# Patient Record
Sex: Male | Born: 1935
Health system: Southern US, Community
[De-identification: ages and names within clinical notes are randomized; demographics above are authoritative.]

## PROBLEM LIST (undated history)

## (undated) DIAGNOSIS — E119 Type 2 diabetes mellitus without complications: Secondary | ICD-10-CM

## (undated) DIAGNOSIS — I10 Essential (primary) hypertension: Secondary | ICD-10-CM

## (undated) HISTORY — PX: OTHER SURGICAL HISTORY: SHX169

## (undated) HISTORY — DX: Type 2 diabetes mellitus without complications: E11.9

## (undated) HISTORY — PX: CHOLECYSTECTOMY: SHX55

## (undated) HISTORY — DX: Essential (primary) hypertension: I10

---

## 2009-07-07 ENCOUNTER — Ambulatory Visit: Payer: Self-pay | Admitting: General Practice

## 2009-07-13 ENCOUNTER — Ambulatory Visit: Payer: Self-pay | Admitting: Surgery

## 2009-07-13 ENCOUNTER — Ambulatory Visit: Payer: Self-pay | Admitting: Cardiovascular Disease

## 2009-07-14 ENCOUNTER — Inpatient Hospital Stay: Payer: Self-pay | Admitting: Surgery

## 2012-11-04 ENCOUNTER — Ambulatory Visit: Payer: Self-pay | Admitting: Ophthalmology

## 2012-12-02 ENCOUNTER — Ambulatory Visit: Payer: Self-pay | Admitting: Ophthalmology

## 2013-04-25 ENCOUNTER — Emergency Department: Payer: Self-pay | Admitting: Emergency Medicine

## 2013-04-25 LAB — BASIC METABOLIC PANEL
Anion Gap: 10 (ref 7–16)
BUN: 22 mg/dL — ABNORMAL HIGH (ref 7–18)
Calcium, Total: 9.5 mg/dL (ref 8.5–10.1)
Chloride: 105 mmol/L (ref 98–107)
Creatinine: 0.77 mg/dL (ref 0.60–1.30)
EGFR (Non-African Amer.): >60
Sodium: 137 mmol/L (ref 136–145)

## 2013-04-25 LAB — CBC
HCT: 44 % (ref 40.0–52.0)
HGB: 14.9 g/dL (ref 13.0–18.0)
MCH: 33.8 pg (ref 26.0–34.0)
MCHC: 33.9 g/dL (ref 32.0–36.0)
Platelet: 187 10*3/uL (ref 150–440)

## 2013-04-27 ENCOUNTER — Encounter: Payer: Self-pay | Admitting: *Deleted

## 2013-04-28 ENCOUNTER — Encounter: Payer: Self-pay | Admitting: Cardiovascular Disease

## 2013-04-28 ENCOUNTER — Ambulatory Visit (INDEPENDENT_AMBULATORY_CARE_PROVIDER_SITE_OTHER): Payer: Medicare Other | Admitting: Cardiovascular Disease

## 2013-04-28 ENCOUNTER — Encounter (INDEPENDENT_AMBULATORY_CARE_PROVIDER_SITE_OTHER): Payer: Self-pay

## 2013-04-28 VITALS — BP 150/87 | HR 69 | Ht 67.0 in | Wt 198.2 lb

## 2013-04-28 DIAGNOSIS — R079 Chest pain, unspecified: Secondary | ICD-10-CM | POA: Insufficient documentation

## 2013-04-28 DIAGNOSIS — I10 Essential (primary) hypertension: Secondary | ICD-10-CM | POA: Insufficient documentation

## 2013-04-28 NOTE — Patient Instructions (Addendum)
[facility] MYOVIEW  Your caregiver has ordered a Stress Test with nuclear imaging. The purpose of this test is to evaluate the blood supply to your heart muscle. This procedure is referred to as a "Non-Invasive Stress Test." This is because other than having an IV started in your vein, nothing is inserted or "invades" your body. Cardiac stress tests are done to find areas of poor blood flow to the heart by determining the extent of coronary artery disease (CAD). Some patients exercise on a treadmill, which naturally increases the blood flow to your heart, while others who are  unable to walk on a treadmill due to physical limitations have a pharmacologic/chemical stress agent called Lexiscan . This medicine will mimic walking on a treadmill by temporarily increasing your coronary blood flow.   Please note: these test may take anywhere between 2-4 hours to complete  PLEASE REPORT TO [facility] MEDICAL MALL ENTRANCE  THE VOLUNTEERS AT THE FIRST DESK WILL DIRECT YOU WHERE TO GO  Date of Procedure:_____________12/26/14________________  Arrival Time for Procedure:________7:45 AM ______________________  Instructions regarding medication:   _x___ : Hold diabetes medication morning of procedure    PLEASE NOTIFY THE OFFICE AT LEAST 24 HOURS IN ADVANCE IF YOU ARE UNABLE TO KEEP YOUR APPOINTMENT.  [phone] AND  PLEASE NOTIFY NUCLEAR MEDICINE AT [facility] AT LEAST 24 HOURS IN ADVANCE IF YOU ARE UNABLE TO KEEP YOUR APPOINTMENT. [phone]  How to prepare for your Myoview test:  1. Do not eat or drink after midnight 2. No caffeine for 24 hours prior to test 3. No smoking 24 hours prior to test. 4. Your medication may be taken with water.  If your doctor stopped a medication because of this test, do not take that medication. 5. Ladies, please do not wear dresses.  Skirts or pants are appropriate. Please wear a short sleeve shirt. 6. No perfume, cologne or lotion. 7. Wear comfortable walking shoes. No  heels!

## 2013-04-28 NOTE — Assessment & Plan Note (Signed)
Blood pressure is mildly elevated but he appears to be anxious today. Continue to monitor.

## 2013-04-28 NOTE — Progress Notes (Signed)
Primary care physician: Dr. Bethann Punches  HPI  This is a pleasant 77 year old male who was referred from the emergency room at [facility] for evaluation of chest pain and abnormal EKG. He has no previous cardiac history. He has multiple chronic medical conditions that include prolonged history of type 2 diabetes, hypertension and possible gastroesophageal reflux disease. He had multiple evaluations in 2011 for abdominal and chest pain and ultimately he was diagnosed with cholecystitis. He underwent cholecystectomy with resolution of symptoms. He went to the emergency room at [facility] this past weekend due to substernal burning sensation which lasted all day. The pain did not radiate. There was no other associated symptoms. It improved with antacids. He was noted to have left bundle branch block on his EKG. He is not aware of this in the past. Chest x-ray showed no acute abnormalities. Cardiac enzymes were negative. His labs were overall unremarkable. He was discharged home on pantoprazole with improvement in symptoms. The patient is married and has 2 periods. He is retired from [employer]. He enjoys hunting and fishing. He is actually going on a hunting trip to [place] on Saturday.  Allergies  Allergen Reactions  . Sulfa Antibiotics      Current Outpatient Prescriptions on File Prior to Visit  Medication Sig Dispense Refill  . amoxicillin-clavulanate (AUGMENTIN) 500-125 MG per tablet Take 1 tablet by mouth as needed.       Marland Kitchen aspirin 81 MG tablet Take 81 mg by mouth daily.      Marland Kitchen glipiZIDE (GLUCOTROL) 5 MG tablet Take by mouth as needed.       Marland Kitchen lisinopril-hydrochlorothiazide (PRINZIDE,ZESTORETIC) 20-25 MG per tablet Take 1 tablet by mouth daily.      . metFORMIN (GLUCOPHAGE) 500 MG tablet Take by mouth 2 (two) times daily with a meal.      . traMADol (ULTRAM) 50 MG tablet Take by mouth 3 (three) times daily as needed.      . verapamil (COVERA HS) 180 MG (CO) 24 hr tablet Take 180 mg by mouth at bedtime.        No current facility-administered medications on file prior to visit.     Past Medical History  Diagnosis Date  . Diabetes mellitus without complication   . Hypertension      Past Surgical History  Procedure Laterality Date  . Cholecystectomy    . Cataract surgery       Family History  Problem Relation Age of Onset  . Stroke Mother      History   Social History  . Marital Status: Married    Spouse Name: N/A    Number of Children: N/A  . Years of Education: N/A   Occupational History  . Not on file.   Social History Main Topics  . Smoking status: Never Smoker   . Smokeless tobacco: Not on file  . Alcohol Use: No  . Drug Use: No  . Sexual Activity: Not on file   Other Topics Concern  . Not on file   Social History Narrative  . No narrative on file     ROS A 10 point review of system was performed. It is negative other than that mentioned in the history of present illness.   PHYSICAL EXAM   BP 150/87  Pulse 69  Ht 5\' 7"  (1.702 m)  Wt 198 lb 4 oz (89.926 kg)  BMI 31.04 kg/m2 Constitutional: He is oriented to person, place, and time. He appears well-developed and well-nourished. No distress.  HENT: No nasal discharge.  Head: Normocephalic and atraumatic.  Eyes: Pupils are equal and round.  No discharge. Neck: Normal range of motion. Neck supple. No JVD present. No thyromegaly present.  Cardiovascular: Normal rate, regular rhythm, normal heart sounds. Exam reveals no gallop and no friction rub. No murmur heard.  Pulmonary/Chest: Effort normal and breath sounds normal. No stridor. No respiratory distress. He has no wheezes. He has no rales. He exhibits no tenderness.  Abdominal: Soft. Bowel sounds are normal. He exhibits no distension. There is no tenderness. There is no rebound and no guarding.  Musculoskeletal: Normal range of motion. He exhibits no edema and no tenderness.  Neurological: He is alert and oriented to person, place, and time.  Coordination normal.  Skin: Skin is warm and dry. No rash noted. He is not diaphoretic. No erythema. No pallor.  Psychiatric: He has a normal mood and affect. His behavior is normal. Judgment and thought content normal.       EKG: Normal sinus rhythm with left bundle branch block.   ASSESSMENT AND PLAN

## 2013-04-28 NOTE — Assessment & Plan Note (Signed)
I suspect that his current chest pain is likely due to GERD. However, he has a left bundle branch block on his EKG and has multiple risk factors for coronary artery disease. Due to that, I recommend further evaluation with a pharmacologic nuclear stress test. If cardiac workup is negative and the patient continues to have recurrent symptoms, further GI evaluation might be needed.

## 2013-04-30 ENCOUNTER — Other Ambulatory Visit: Payer: Self-pay | Admitting: *Deleted

## 2013-04-30 ENCOUNTER — Ambulatory Visit: Payer: Self-pay | Admitting: Cardiovascular Disease

## 2013-04-30 DIAGNOSIS — R079 Chest pain, unspecified: Secondary | ICD-10-CM

## 2013-05-20 ENCOUNTER — Ambulatory Visit: Payer: Self-pay | Admitting: Cardiovascular Disease

## 2015-07-27 DIAGNOSIS — J40 Bronchitis, not specified as acute or chronic: Secondary | ICD-10-CM | POA: Diagnosis not present

## 2015-10-17 DIAGNOSIS — Z0000 Encounter for general adult medical examination without abnormal findings: Secondary | ICD-10-CM | POA: Diagnosis not present

## 2015-10-17 DIAGNOSIS — E119 Type 2 diabetes mellitus without complications: Secondary | ICD-10-CM | POA: Diagnosis not present

## 2015-10-24 DIAGNOSIS — E119 Type 2 diabetes mellitus without complications: Secondary | ICD-10-CM | POA: Diagnosis not present

## 2015-10-24 DIAGNOSIS — I10 Essential (primary) hypertension: Secondary | ICD-10-CM | POA: Diagnosis not present

## 2015-10-31 DIAGNOSIS — H0011 Chalazion right upper eyelid: Secondary | ICD-10-CM | POA: Diagnosis not present

## 2015-11-29 DIAGNOSIS — L03031 Cellulitis of right toe: Secondary | ICD-10-CM | POA: Diagnosis not present

## 2015-11-29 DIAGNOSIS — L02611 Cutaneous abscess of right foot: Secondary | ICD-10-CM | POA: Diagnosis not present

## 2016-04-05 DIAGNOSIS — L089 Local infection of the skin and subcutaneous tissue, unspecified: Secondary | ICD-10-CM | POA: Diagnosis not present

## 2016-04-05 DIAGNOSIS — Z23 Encounter for immunization: Secondary | ICD-10-CM | POA: Diagnosis not present

## 2016-04-05 DIAGNOSIS — S60940A Unspecified superficial injury of right index finger, initial encounter: Secondary | ICD-10-CM | POA: Diagnosis not present

## 2016-04-05 DIAGNOSIS — K2900 Acute gastritis without bleeding: Secondary | ICD-10-CM | POA: Diagnosis not present

## 2016-04-16 DIAGNOSIS — M216X1 Other acquired deformities of right foot: Secondary | ICD-10-CM | POA: Diagnosis not present

## 2016-04-16 DIAGNOSIS — D2371 Other benign neoplasm of skin of right lower limb, including hip: Secondary | ICD-10-CM | POA: Diagnosis not present

## 2016-04-16 DIAGNOSIS — M79671 Pain in right foot: Secondary | ICD-10-CM | POA: Diagnosis not present

## 2016-04-17 DIAGNOSIS — E119 Type 2 diabetes mellitus without complications: Secondary | ICD-10-CM | POA: Diagnosis not present

## 2016-04-17 DIAGNOSIS — Z125 Encounter for screening for malignant neoplasm of prostate: Secondary | ICD-10-CM | POA: Diagnosis not present

## 2016-04-22 DIAGNOSIS — E119 Type 2 diabetes mellitus without complications: Secondary | ICD-10-CM | POA: Diagnosis not present

## 2016-04-24 DIAGNOSIS — E119 Type 2 diabetes mellitus without complications: Secondary | ICD-10-CM | POA: Diagnosis not present

## 2016-04-24 DIAGNOSIS — Z0000 Encounter for general adult medical examination without abnormal findings: Secondary | ICD-10-CM | POA: Diagnosis not present

## 2016-04-24 DIAGNOSIS — M216X1 Other acquired deformities of right foot: Secondary | ICD-10-CM | POA: Diagnosis not present

## 2016-07-25 DIAGNOSIS — J042 Acute laryngotracheitis: Secondary | ICD-10-CM | POA: Diagnosis not present

## 2016-09-26 DIAGNOSIS — M109 Gout, unspecified: Secondary | ICD-10-CM | POA: Diagnosis not present

## 2016-10-16 DIAGNOSIS — E119 Type 2 diabetes mellitus without complications: Secondary | ICD-10-CM | POA: Diagnosis not present

## 2016-10-23 DIAGNOSIS — Z0000 Encounter for general adult medical examination without abnormal findings: Secondary | ICD-10-CM | POA: Diagnosis not present

## 2016-10-23 DIAGNOSIS — E119 Type 2 diabetes mellitus without complications: Secondary | ICD-10-CM | POA: Diagnosis not present

## 2016-10-23 DIAGNOSIS — Z125 Encounter for screening for malignant neoplasm of prostate: Secondary | ICD-10-CM | POA: Diagnosis not present

## 2017-01-27 ENCOUNTER — Emergency Department
Admission: EM | Admit: 2017-01-27 | Discharge: 2017-01-27 | Disposition: A | Discharge status: Home / Self Care | Payer: PPO | Attending: Emergency Medicine | Admitting: Emergency Medicine

## 2017-01-27 ENCOUNTER — Emergency Department: Payer: PPO

## 2017-01-27 ENCOUNTER — Encounter: Payer: Self-pay | Admitting: Emergency Medicine

## 2017-01-27 DIAGNOSIS — S0990XA Unspecified injury of head, initial encounter: Secondary | ICD-10-CM | POA: Diagnosis not present

## 2017-01-27 DIAGNOSIS — Z7984 Long term (current) use of oral hypoglycemic drugs: Secondary | ICD-10-CM | POA: Insufficient documentation

## 2017-01-27 DIAGNOSIS — Z79899 Other long term (current) drug therapy: Secondary | ICD-10-CM | POA: Diagnosis not present

## 2017-01-27 DIAGNOSIS — M546 Pain in thoracic spine: Secondary | ICD-10-CM

## 2017-01-27 DIAGNOSIS — W228XXA Striking against or struck by other objects, initial encounter: Secondary | ICD-10-CM | POA: Insufficient documentation

## 2017-01-27 DIAGNOSIS — S0003XA Contusion of scalp, initial encounter: Secondary | ICD-10-CM | POA: Diagnosis not present

## 2017-01-27 DIAGNOSIS — Y9301 Activity, walking, marching and hiking: Secondary | ICD-10-CM | POA: Diagnosis not present

## 2017-01-27 DIAGNOSIS — Z7982 Long term (current) use of aspirin: Secondary | ICD-10-CM | POA: Insufficient documentation

## 2017-01-27 DIAGNOSIS — W19XXXA Unspecified fall, initial encounter: Secondary | ICD-10-CM

## 2017-01-27 DIAGNOSIS — I10 Essential (primary) hypertension: Secondary | ICD-10-CM | POA: Diagnosis not present

## 2017-01-27 DIAGNOSIS — Y999 Unspecified external cause status: Secondary | ICD-10-CM | POA: Insufficient documentation

## 2017-01-27 DIAGNOSIS — Y929 Unspecified place or not applicable: Secondary | ICD-10-CM | POA: Insufficient documentation

## 2017-01-27 DIAGNOSIS — M545 Low back pain, unspecified: Secondary | ICD-10-CM

## 2017-01-27 DIAGNOSIS — E119 Type 2 diabetes mellitus without complications: Secondary | ICD-10-CM | POA: Diagnosis not present

## 2017-01-27 DIAGNOSIS — M62830 Muscle spasm of back: Secondary | ICD-10-CM | POA: Insufficient documentation

## 2017-01-27 MED ORDER — KETOROLAC TROMETHAMINE 60 MG/2ML IM SOLN
30.0000 mg | Freq: Once | INTRAMUSCULAR | Status: AC
  Administered 2017-01-27: 30 mg via INTRAMUSCULAR
  Filled 2017-01-27: qty 2

## 2017-01-27 MED ORDER — CYCLOBENZAPRINE HCL 5 MG PO TABS: 5.0000 mg | ORAL_TABLET | Freq: Three times a day (TID) | ORAL | 0 refills | Status: AC | PRN

## 2017-01-27 MED ORDER — KETOROLAC TROMETHAMINE 10 MG PO TABS: 10.0000 mg | ORAL_TABLET | Freq: Four times a day (QID) | ORAL | 0 refills | Status: AC | PRN

## 2017-01-27 NOTE — ED Notes (Signed)
See triage note  States he was trying to stop a golf cart  And fell from cart  Hit head no cement  Large hematoma noted  But also having pain to mid to lower back  Increased pain with walking and breathing

## 2017-01-27 NOTE — ED Triage Notes (Signed)
Patient presents to ED via POV from home post fall. Patient was attempting to stop his golf cart from rolling when he fell and hit the back of his head on the cement. No bleeding noted. Patient denies taking any blood thinners. Denies LOC.

## 2017-01-27 NOTE — ED Provider Notes (Signed)
[facility] Emergency Department Provider Note   ____________________________________________   I have reviewed the triage vital signs and the nursing notes.   HISTORY  Chief Complaint Head Injury    HPI Jonathan Hayden. is a 81 y.o. male presents to the department with hematoma along the posterior scalp he sustained when a ladder sitting in a golf cart hit the acceleration pedal pulling him along when he lost his balance and hit the back of his head and his back. Patient denies loss of consciousness and he recalls the accident.Patient reports difficulty getting to a standing position due to thoracic and lumbar back pain. He cried assistance of his wife once she arrived home. Patient denies vision changes, nausea, vomiting or changes in his neurological baseline. Patient's wife confirmed no change in neurological baseline. Patient reports long history of chronic back pain with recent exacerbation when he was climbing onto his tractor and driving it several hours earlier this summer. Patient denies fever, chills, headache, vision changes, chest pain, chest tightness, shortness of breath, abdominal pain, nausea and vomiting.  Past Medical History:  Diagnosis Date  . Diabetes mellitus without complication ([place])   . Hypertension     Patient Active Problem List   Diagnosis Date Noted  . Chest pain 04/28/2013  . Hypertension     Past Surgical History:  Procedure Laterality Date  . CATARACT SURGERY    . CHOLECYSTECTOMY      Prior to Admission medications   Medication Sig Start Date End Date Taking? Authorizing Provider  amoxicillin-clavulanate (AUGMENTIN) 500-125 MG per tablet Take 1 tablet by mouth as needed.     [provider]  aspirin 81 MG tablet Take 81 mg by mouth daily.    [provider]  Coenzyme Q10 (CO Q 10) 100 MG CAPS Take by mouth daily.    [provider]  Cyanocobalamin (VITAMIN B-12 SL) Place under the tongue  daily.    [provider]  cyclobenzaprine (FLEXERIL) 5 MG tablet Take 1 tablet (5 mg total) by mouth 3 (three) times daily as needed. 01/27/17   Little, Traci M, PA-C  glipiZIDE (GLUCOTROL) 5 MG tablet Take by mouth as needed.     [provider]  lisinopril-hydrochlorothiazide (PRINZIDE,ZESTORETIC) 20-25 MG per tablet Take 1 tablet by mouth daily.    [provider]  metFORMIN (GLUCOPHAGE) 500 MG tablet Take by mouth 2 (two) times daily with a meal.    [provider]  Omega-3 Fatty Acids (FISH OIL) 1200 MG CAPS Take by mouth 2 (two) times daily.    [provider]  omeprazole (PRILOSEC) 40 MG capsule Take 40 mg by mouth daily.    [provider]  pantoprazole (PROTONIX) 20 MG tablet Take 20 mg by mouth as needed.    [provider]  Resveratrol 250 MG CAPS Take by mouth daily.    [provider]  SIMETHICONE PO Take by mouth as needed.    [provider]  traMADol (ULTRAM) 50 MG tablet Take by mouth 3 (three) times daily as needed.    [provider]  verapamil (COVERA HS) 180 MG (CO) 24 hr tablet Take 180 mg by mouth at bedtime.    [provider]    Allergies Sulfa antibiotics  Family History  Problem Relation Age of Onset  . Stroke Mother     Social History Social History  Substance Use Topics  . Smoking status: Never Smoker  . Smokeless tobacco: Not  on file  . Alcohol use No    Review of Systems Constitutional: Negative for fever/chills Head: Hematoma along posterior scalp tenderness to palpation. Eyes: No visual changes. ENT:  Negative for sore throat and for difficulty swallowing Cardiovascular: Denies chest pain. Respiratory: Denies cough. Denies shortness of breath. Musculoskeletal: Positive for thoracic and lumbar back pain. Skin: Negative for rash. Neurological: Negative for headaches.  Negative focal weakness or numbness. Negative for loss of consciousness. Able to  ambulate. ____________________________________________   PHYSICAL EXAM:  VITAL SIGNS: ED Triage Vitals [01/27/17 1536]  Enc Vitals Group     BP 133/88     Pulse Rate 99     Resp 18     Temp 98 F (36.7 C)     Temp Source Oral     SpO2 97 %     Weight 196 lb (88.9 kg)     Height 5\' 6"  (1.676 m)     Head Circumference      Peak Flow      Pain Score 3     Pain Loc      Pain Edu?      Excl. in [place]?     Constitutional: Alert and oriented. Well appearing and in no acute distress.  Eyes: Conjunctivae are normal. PERRL. EOMI  Head: Normocephalic and atraumatic. Hematoma along the posterior scalp.  ENT:      Ears: Canals clear. TMs intact bilaterally.      Nose: No congestion/rhinnorhea.      Mouth/Throat: Mucous membranes are moist. Neck:Supple. No thyromegaly. No stridor.  Cardiovascular: Normal rate, regular rhythm. Normal S1 and S2.  Good peripheral circulation. Respiratory: Normal respiratory effort without tachypnea or retractions. Lungs CTAB. No wheezes/rales/rhonchi. Good air entry to the bases with no decreased or absent breath sounds. Cardiovascular: Normal rate, regular rhythm. Normal distal pulses. Gastrointestinal: Bowel sounds 4 quadrants. Soft and nontender to palpation. No guarding or rigidity. No nausea vomiting or diarrhea. Musculoskeletal: Nontender with normal range of motion in all extremities. Thoracic and lumbar back pain without tenderness along spinous processes or deformity. Palpable tenderness along the paraspinal musculature. Negative radiculopathy, bowel or bladder dsyfunction or saddle anesthesia.  Neurologic: Normal speech and language. No gross focal neurologic deficits are appreciated. No gait instability.  Skin:  Skin is warm, dry and intact. No rash noted. Psychiatric: Mood and affect are normal. Speech and behavior are normal. Patient exhibits appropriate insight and judgement.  ____________________________________________   LABS (all labs  ordered are listed, but only abnormal results are displayed)  Labs Reviewed - No data to display ____________________________________________  EKG none ____________________________________________  RADIOLOGY CT head w/o contrast IMPRESSION: No acute intracranial process.  Soft tissue swelling overlying the dorsal left calvarium.  ____________________________________________   PROCEDURES  Procedure(s) performed: no    Critical Care performed: no ____________________________________________   INITIAL IMPRESSION / ASSESSMENT AND PLAN / ED COURSE  Pertinent labs & imaging results that were available during my care of the patient were reviewed by me and considered in my medical decision making (see chart for details).   Patient presents to emergency department with posterior scalp hematoma, thoracic and lumbar back pain pain following a fall earlier today as described in the history above. History, physical exam findings and imaging are reassuring symptoms are consistent with thoracic and lumbar strain and symptoms related to scalp hematoma. Patient noted improvement of symptoms following Toradol given during the course of care in the emergency department. Patient will be prescribed flexeril  as needed for  muscle spasms. Patient advised to follow up with PCP as needed or return to the emergency department if symptoms return or worsen. Patient informed of clinical course, understand medical decision-making process, and agree with plan.   ____________________________________________   FINAL CLINICAL IMPRESSION(S) / ED DIAGNOSES  Final diagnoses:  Contusion of scalp, initial encounter  Minor head injury, initial encounter  Lumbar back pain  Acute bilateral thoracic back pain  Spasm of back muscles  Fall, initial encounter       NEW MEDICATIONS STARTED DURING THIS VISIT:  New Prescriptions   CYCLOBENZAPRINE (FLEXERIL) 5 MG TABLET    Take 1 tablet (5 mg total) by mouth  3 (three) times daily as needed.     Note:  This document was prepared using Dragon voice recognition software and may include unintentional dictation errors.    Alric Quan 01/27/17 1840    Harvest Dark, MD 01/28/17 2016

## 2017-01-27 NOTE — Discharge Instructions (Signed)
Continue to monitor symptoms for the next 24 hours if you notice changes in neurological baseline, vision changes, nausea or vomiting do not hesitate to return to the emergency department.  You may take over-the-counter Tylenol or ibuprofen for pain and inflammation. Cold or heat therapy to reduce back symptoms as well as modify activities until symptoms improve.

## 2017-04-04 DIAGNOSIS — H00012 Hordeolum externum right lower eyelid: Secondary | ICD-10-CM | POA: Diagnosis not present

## 2017-04-23 DIAGNOSIS — Z125 Encounter for screening for malignant neoplasm of prostate: Secondary | ICD-10-CM | POA: Diagnosis not present

## 2017-04-23 DIAGNOSIS — E119 Type 2 diabetes mellitus without complications: Secondary | ICD-10-CM | POA: Diagnosis not present

## 2017-04-25 DIAGNOSIS — E119 Type 2 diabetes mellitus without complications: Secondary | ICD-10-CM | POA: Diagnosis not present

## 2017-04-25 DIAGNOSIS — M659 Synovitis and tenosynovitis, unspecified: Secondary | ICD-10-CM | POA: Diagnosis not present

## 2017-04-25 DIAGNOSIS — Z0000 Encounter for general adult medical examination without abnormal findings: Secondary | ICD-10-CM | POA: Diagnosis not present

## 2018-10-09 DIAGNOSIS — E119 Type 2 diabetes mellitus without complications: Secondary | ICD-10-CM | POA: Diagnosis not present

## 2018-10-16 DIAGNOSIS — Z0000 Encounter for general adult medical examination without abnormal findings: Secondary | ICD-10-CM | POA: Diagnosis not present

## 2018-10-16 DIAGNOSIS — E119 Type 2 diabetes mellitus without complications: Secondary | ICD-10-CM | POA: Diagnosis not present

## 2018-10-16 DIAGNOSIS — E538 Deficiency of other specified B group vitamins: Secondary | ICD-10-CM | POA: Diagnosis not present

## 2018-10-16 DIAGNOSIS — Z125 Encounter for screening for malignant neoplasm of prostate: Secondary | ICD-10-CM | POA: Diagnosis not present

## 2018-10-16 DIAGNOSIS — M791 Myalgia, unspecified site: Secondary | ICD-10-CM | POA: Diagnosis not present

## 2018-12-18 DIAGNOSIS — M19012 Primary osteoarthritis, left shoulder: Secondary | ICD-10-CM | POA: Diagnosis not present

## 2018-12-18 DIAGNOSIS — M19011 Primary osteoarthritis, right shoulder: Secondary | ICD-10-CM | POA: Diagnosis not present

## 2019-01-25 DIAGNOSIS — M5116 Intervertebral disc disorders with radiculopathy, lumbar region: Secondary | ICD-10-CM | POA: Diagnosis not present

## 2019-01-25 DIAGNOSIS — M25521 Pain in right elbow: Secondary | ICD-10-CM | POA: Diagnosis not present

## 2019-02-17 ENCOUNTER — Ambulatory Visit
Admission: EM | Admit: 2019-02-17 | Discharge: 2019-02-17 | Disposition: A | Discharge status: Home / Self Care | Payer: PPO | Attending: Family Medicine | Admitting: Family Medicine

## 2019-02-17 ENCOUNTER — Other Ambulatory Visit: Payer: Self-pay

## 2019-02-17 DIAGNOSIS — H6123 Impacted cerumen, bilateral: Secondary | ICD-10-CM

## 2019-02-17 NOTE — ED Triage Notes (Signed)
Patient states that he went to be fitted for hearing aids this morning but was told he had bilateral wax buildup and they couldn't do it.

## 2019-02-17 NOTE — ED Provider Notes (Signed)
[facility] ____________________________________________  Time seen: Approximately 2:52 PM  I have reviewed the triage vital signs and the nursing notes.   HISTORY  Chief Complaint Cerumen Impaction  HPI Jonathan Hayden. is a 83 y.o. male presenting for evaluation of bilateral ear wax impaction.  Patient states that he went to get fitted for hearing aids but they were unable to do so due to the wax being present. Denies any pain. Tried to remove wax himself at home unsuccessfully.  Denies any acute hearing changes.  No recent cough or sickness.  Rusty Aus, MD: PCP   Past Medical History:  Diagnosis Date  . Diabetes mellitus without complication ([place])   . Hypertension     Patient Active Problem List   Diagnosis Date Noted  . Chest pain 04/28/2013  . Hypertension     Past Surgical History:  Procedure Laterality Date  . CATARACT SURGERY    . CHOLECYSTECTOMY       No current facility-administered medications for this encounter.   Current Outpatient Medications:  .  Cyanocobalamin (VITAMIN B-12 SL), Place under the tongue daily., Disp: , Rfl:  .  glipiZIDE (GLUCOTROL) 5 MG tablet, Take by mouth as needed. , Disp: , Rfl:  .  lisinopril-hydrochlorothiazide (PRINZIDE,ZESTORETIC) 20-25 MG per tablet, Take 1 tablet by mouth daily., Disp: , Rfl:  .  metFORMIN (GLUCOPHAGE) 500 MG tablet, Take by mouth 2 (two) times daily with a meal., Disp: , Rfl:  .  omeprazole (PRILOSEC) 40 MG capsule, Take 40 mg by mouth daily., Disp: , Rfl:  .  Resveratrol 250 MG CAPS, Take by mouth daily., Disp: , Rfl:  .  SIMETHICONE PO, Take by mouth as needed., Disp: , Rfl:  .  verapamil (COVERA HS) 180 MG (CO) 24 hr tablet, Take 180 mg by mouth at bedtime., Disp: , Rfl:  .  amoxicillin-clavulanate (AUGMENTIN) 500-125 MG per tablet, Take 1 tablet by mouth as needed. , Disp: , Rfl:  .  aspirin 81 MG tablet, Take 81 mg by mouth daily., Disp: , Rfl:  .  Coenzyme Q10 (CO Q 10) 100 MG CAPS,  Take by mouth daily., Disp: , Rfl:  .  cyclobenzaprine (FLEXERIL) 5 MG tablet, Take 1 tablet (5 mg total) by mouth 3 (three) times daily as needed., Disp: 20 tablet, Rfl: 0 .  Omega-3 Fatty Acids (FISH OIL) 1200 MG CAPS, Take by mouth 2 (two) times daily., Disp: , Rfl:  .  pantoprazole (PROTONIX) 20 MG tablet, Take 20 mg by mouth as needed., Disp: , Rfl:  .  traMADol (ULTRAM) 50 MG tablet, Take by mouth 3 (three) times daily as needed., Disp: , Rfl:   Allergies Sulfa antibiotics  Family History  Problem Relation Age of Onset  . Stroke Mother     Social History Social History   Tobacco Use  . Smoking status: Never Smoker  . Smokeless tobacco: Never Used  Substance Use Topics  . Alcohol use: Yes    Comment: occasionally  . Drug use: No    Review of Systems Constitutional: No fever ENT: As above.  Cardiovascular: Denies chest pain. Respiratory: Denies shortness of breath. Musculoskeletal: Negative for back pain. Skin: Negative for rash.  ____________________________________________   PHYSICAL EXAM:  VITAL SIGNS: ED Triage Vitals  Enc Vitals Group     BP 02/17/19 1438 130/84     Pulse Rate 02/17/19 1438 87     Resp 02/17/19 1438 18     Temp 02/17/19 1438 98.6 F (  37 C)     Temp Source 02/17/19 1438 Oral     SpO2 02/17/19 1438 99 %     Weight 02/17/19 1433 184 lb (83.5 kg)     Height 02/17/19 1433 5\' 6"  (1.676 m)     Head Circumference --      Peak Flow --      Pain Score 02/17/19 1433 0     Pain Loc --      Pain Edu? --      Excl. in [place]? --     Constitutional: Alert and oriented. Well appearing and in no acute distress. Eyes: Conjunctivae are normal.  ENT      Head: Normocephalic.      Ears: Right: Nontender, total cerumen impaction, post cerumen removal, canal clear, no erythema, normal TM.  Left: Nontender, total cerumen impaction, post removal, canal clear, non-tender, normal TM. Musculoskeletal:  Steady gait.  Neurologic:  Normal speech and language.   Skin:  Skin is warm, dry  Psychiatric: Mood and affect are normal.    ___________________________________________   LABS (all labs ordered are listed, but only abnormal results are displayed)  Labs Reviewed - No data to display   PROCEDURES Procedures   Cerumen impaction.  Cerumen removed by nursing staff with irrigation.  Post cerumen removal, canal cleared.  Patient tolerated well.  INITIAL IMPRESSION / ASSESSMENT AND PLAN / ED COURSE  Pertinent labs & imaging results that were available during my care of the patient were reviewed by me and considered in my medical decision making (see chart for details).  Well-appearing patient.  No acute distress.  Total cerumen impaction bilaterally, removed with irrigation.  Patient tolerated well.  Clear post removal.  Discussed follow up and return parameters including no resolution or any worsening concerns. Patient verbalized understanding and agreed to plan.   ____________________________________________   FINAL CLINICAL IMPRESSION(S) / ED DIAGNOSES  Final diagnoses:  Bilateral impacted cerumen     ED Discharge Orders    None       Note: This dictation was prepared with Dragon dictation along with smaller phrase technology. Any transcriptional errors that result from this process are unintentional.         Marylene Land, NP 02/17/19 1532

## 2019-04-02 ENCOUNTER — Other Ambulatory Visit: Payer: Self-pay

## 2019-04-02 ENCOUNTER — Ambulatory Visit
Admission: EM | Admit: 2019-04-02 | Discharge: 2019-04-02 | Disposition: A | Discharge status: Home / Self Care | Payer: PPO | Attending: Family Medicine | Admitting: Family Medicine

## 2019-04-02 ENCOUNTER — Encounter: Payer: Self-pay | Admitting: Emergency Medicine

## 2019-04-02 DIAGNOSIS — Z20828 Contact with and (suspected) exposure to other viral communicable diseases: Secondary | ICD-10-CM

## 2019-04-02 DIAGNOSIS — R197 Diarrhea, unspecified: Secondary | ICD-10-CM

## 2019-04-02 DIAGNOSIS — Z20822 Contact with and (suspected) exposure to covid-19: Secondary | ICD-10-CM

## 2019-04-02 NOTE — ED Triage Notes (Signed)
Pt c/o diarrhea, fatigue and body. Started about a week ago.

## 2019-04-02 NOTE — ED Provider Notes (Signed)
[facility]    CSN: [id number] Arrival date & time: 04/02/19  1118      History   Chief Complaint Chief Complaint  Patient presents with   Diarrhea    HPI Jonathan Hayden. is a 83 y.o. male.   Subjective:   Jonathan Hayden. is a 83 y.o. male who presents for evaluation of influenza/COVID like symptoms. Symptoms include chills, dizziness, nausea and diarrhea. He denies any fevers, body aches, sore throat, cough, shortness of breath, vomiting, headache or change in taste/smell. Symptoms have been present for 5 days. He has tried to alleviate the symptoms with rest and OTC anti-diarrheal medications with minimal relief.  He denies any known exposure to COVID-19.  High risk factors for COVID complications include age greater than 68 years of age and co-morbid illness.  The following portions of the patient's history were reviewed and updated as appropriate: allergies, current medications, past family history, past medical history, past social history, past surgical history and problem list.        Past Medical History:  Diagnosis Date   Diabetes mellitus without complication ([place])    Hypertension     Patient Active Problem List   Diagnosis Date Noted   Chest pain 04/28/2013   Hypertension     Past Surgical History:  Procedure Laterality Date   CATARACT SURGERY     CHOLECYSTECTOMY         Home Medications    Prior to Admission medications   Medication Sig Start Date End Date Taking? Authorizing Provider  Cyanocobalamin (VITAMIN B-12 SL) Place under the tongue daily.   Yes [provider]  lisinopril-hydrochlorothiazide (PRINZIDE,ZESTORETIC) 20-25 MG per tablet Take 1 tablet by mouth daily.   Yes [provider]  metFORMIN (GLUCOPHAGE) 500 MG tablet Take by mouth 2 (two) times daily with a meal.   Yes [provider]  omeprazole (PRILOSEC) 40 MG capsule Take 40 mg by mouth daily.   Yes [provider]    SIMETHICONE PO Take by mouth as needed.   Yes [provider]  traMADol (ULTRAM) 50 MG tablet Take by mouth 3 (three) times daily as needed.   Yes [provider]  amoxicillin-clavulanate (AUGMENTIN) 500-125 MG per tablet Take 1 tablet by mouth as needed.     [provider]  aspirin 81 MG tablet Take 81 mg by mouth daily.    [provider]  Coenzyme Q10 (CO Q 10) 100 MG CAPS Take by mouth daily.    [provider]  cyclobenzaprine (FLEXERIL) 5 MG tablet Take 1 tablet (5 mg total) by mouth 3 (three) times daily as needed. 01/27/17   Little, Traci M, PA-C  glipiZIDE (GLUCOTROL) 5 MG tablet Take by mouth as needed.     [provider]  Omega-3 Fatty Acids (FISH OIL) 1200 MG CAPS Take by mouth 2 (two) times daily.    [provider]  pantoprazole (PROTONIX) 20 MG tablet Take 20 mg by mouth as needed.    [provider]  Resveratrol 250 MG CAPS Take by mouth daily.    [provider]  verapamil (COVERA HS) 180 MG (CO) 24 hr tablet Take 180 mg by mouth at bedtime.    [provider]    Family History Family History  Problem Relation Age of Onset   Stroke Mother     Social History Social History   Tobacco Use   Smoking status: Never Smoker   Smokeless tobacco:  Never Used  Substance Use Topics   Alcohol use: Yes    Comment: occasionally   Drug use: No     Allergies   Sulfa antibiotics   Review of Systems Review of Systems  Constitutional: Negative for fever.  HENT: Negative for sore throat.   Respiratory: Negative for cough and shortness of breath.   Gastrointestinal: Positive for diarrhea and nausea. Negative for abdominal pain and vomiting.  Musculoskeletal: Negative for myalgias.  Neurological: Positive for light-headedness. Negative for headaches.  All other systems reviewed and are negative.    Physical Exam Triage Vital Signs ED Triage Vitals  Enc Vitals Group     BP  04/02/19 1307 132/65     Pulse Rate 04/02/19 1307 64     Resp 04/02/19 1307 18     Temp 04/02/19 1307 98.2 F (36.8 C)     Temp Source 04/02/19 1307 Oral     SpO2 04/02/19 1307 100 %     Weight 04/02/19 1302 182 lb (82.6 kg)     Height 04/02/19 1302 5\' 5"  (1.651 m)     Head Circumference --      Peak Flow --      Pain Score 04/02/19 1301 0     Pain Loc --      Pain Edu? --      Excl. in [place]? --    No data found.  Updated Vital Signs BP 132/65 (BP Location: Left Arm)    Pulse 64    Temp 98.2 F (36.8 C) (Oral)    Resp 18    Ht 5\' 5"  (1.651 m)    Wt 182 lb (82.6 kg)    SpO2 100%    BMI 30.29 kg/m   Visual Acuity Right Eye Distance:   Left Eye Distance:   Bilateral Distance:    Right Eye Near:   Left Eye Near:    Bilateral Near:     Physical Exam Vitals signs reviewed.  Constitutional:      General: He is not in acute distress.    Appearance: Normal appearance. He is normal weight. He is not ill-appearing, toxic-appearing or diaphoretic.  HENT:     Head: Normocephalic.  Neck:     Musculoskeletal: Normal range of motion and neck supple.  Cardiovascular:     Rate and Rhythm: Normal rate and regular rhythm.  Pulmonary:     Effort: Pulmonary effort is normal.     Breath sounds: Normal breath sounds.  Abdominal:     General: Bowel sounds are normal. There is no distension.     Palpations: Abdomen is soft.     Tenderness: There is no abdominal tenderness.  Musculoskeletal: Normal range of motion.  Skin:    General: Skin is warm and dry.  Neurological:     General: No focal deficit present.     Mental Status: He is alert and oriented to person, place, and time.  Psychiatric:        Mood and Affect: Mood normal.        Behavior: Behavior normal.      UC Treatments / Results  Labs (all labs ordered are listed, but only abnormal results are displayed) Labs Reviewed  NOVEL CORONAVIRUS, NAA (HOSP ORDER, SEND-OUT TO REF LAB; TAT 18-24 HRS)     EKG   Radiology No results found.  Procedures Procedures (including critical care time)  Medications Ordered in UC Medications - No data to display  Initial Impression / Assessment and Plan /  UC Course  I have reviewed the triage vital signs and the nursing notes.  Pertinent labs & imaging results that were available during my care of the patient were reviewed by me and considered in my medical decision making (see chart for details).    83 year old male presenting with a 5-day history of chills, dizziness, nausea and diarrhea.  No fevers, body aches, sore throat, cough, shortness of breath, vomiting, headache or change in taste/smell.  No known exposure to COVID-19.  Supportive care advised.  Fluids.  Rest.  Isolation per CDC guidelines.  COVID-19 test pending. Supportive care with appropriate antipyretics and fluids. Educational material distributed and questions answered.  Today's evaluation has revealed no signs of a dangerous process. Discussed diagnosis with patient and/or guardian. Patient and/or guardian aware of their diagnosis, possible red flag symptoms to watch out for and need for close follow up. Patient and/or guardian understands verbal and written discharge instructions. Patient and/or guardian comfortable with plan and disposition.  Patient and/or guardian has a clear mental status at this time, good insight into illness (after discussion and teaching) and has clear judgment to make decisions regarding their care  This care was provided during an unprecedented National Emergency due to the Novel Coronavirus (COVID-19) pandemic. COVID-19 infections and transmission risks place heavy strains on healthcare resources.  As this pandemic evolves, our facility, providers, and staff strive to respond fluidly, to remain operational, and to provide care relative to available resources and information. Outcomes are unpredictable and treatments are without well-defined guidelines.  Further, the impact of COVID-19 on all aspects of urgent care, including the impact to patients seeking care for reasons other than COVID-19, is unavoidable during this national emergency. At this time of the global pandemic, management of patients has significantly changed, even for non-COVID positive patients given high local and regional COVID volumes at this time requiring high healthcare system and resource utilization. The standard of care for management of both COVID suspected and non-COVID suspected patients continues to change rapidly at the local, regional, national, and global levels. This patient was worked up and treated to the best available but ever changing evidence and resources available at this current time.   Documentation was completed with the aid of voice recognition software. Transcription may contain typographical errors. Final Clinical Impressions(s) / UC Diagnoses   Final diagnoses:  Encounter for screening laboratory testing for COVID-19 virus  Diarrhea, unspecified type     Discharge Instructions     Drink plenty of fluids.   Stay in home isolation until you receive results of your COVID test.   You will only be notified for positive results.  Call on Sunday or Monday to get your results.    Please follow CDC guidelines that are attached.   You may discontinue home isolation when there has been at least 10 days since symptoms onset AND 3 days fever free without antipyretics (Tylenol or Ibuprofen) AND an overall improvement in your symptoms.   Go to the ED immediately if you get worse or have any other symptoms.   Feel better soon!  Aldona Bar, FNP-C     ED Prescriptions    None     PDMP not reviewed this encounter.   Enrique Sack, [place] 04/02/19 1450

## 2019-04-02 NOTE — Discharge Instructions (Addendum)
Drink plenty of fluids.   Stay in home isolation until you receive results of your COVID test.   You will only be notified for positive results.  Call on Sunday or Monday to get your results.    Please follow CDC guidelines that are attached.   You may discontinue home isolation when there has been at least 10 days since symptoms onset AND 3 days fever free without antipyretics (Tylenol or Ibuprofen) AND an overall improvement in your symptoms.   Go to the ED immediately if you get worse or have any other symptoms.   Feel better soon!  Aldona Bar, FNP-C

## 2019-04-03 LAB — NOVEL CORONAVIRUS, NAA (HOSP ORDER, SEND-OUT TO REF LAB; TAT 18-24 HRS): SARS-CoV-2, NAA: NOT DETECTED

## 2019-04-12 DIAGNOSIS — Z125 Encounter for screening for malignant neoplasm of prostate: Secondary | ICD-10-CM | POA: Diagnosis not present

## 2019-04-12 DIAGNOSIS — E538 Deficiency of other specified B group vitamins: Secondary | ICD-10-CM | POA: Diagnosis not present

## 2019-04-12 DIAGNOSIS — E119 Type 2 diabetes mellitus without complications: Secondary | ICD-10-CM | POA: Diagnosis not present

## 2019-04-19 DIAGNOSIS — T466X5A Adverse effect of antihyperlipidemic and antiarteriosclerotic drugs, initial encounter: Secondary | ICD-10-CM | POA: Diagnosis not present

## 2019-04-19 DIAGNOSIS — E119 Type 2 diabetes mellitus without complications: Secondary | ICD-10-CM | POA: Diagnosis not present

## 2019-04-19 DIAGNOSIS — M791 Myalgia, unspecified site: Secondary | ICD-10-CM | POA: Diagnosis not present

## 2019-04-19 DIAGNOSIS — Z0000 Encounter for general adult medical examination without abnormal findings: Secondary | ICD-10-CM | POA: Diagnosis not present

## 2019-04-19 DIAGNOSIS — B349 Viral infection, unspecified: Secondary | ICD-10-CM | POA: Diagnosis not present

## 2019-04-21 DIAGNOSIS — M19011 Primary osteoarthritis, right shoulder: Secondary | ICD-10-CM | POA: Diagnosis not present

## 2019-04-21 DIAGNOSIS — M19012 Primary osteoarthritis, left shoulder: Secondary | ICD-10-CM | POA: Diagnosis not present

## 2019-07-02 DIAGNOSIS — E119 Type 2 diabetes mellitus without complications: Secondary | ICD-10-CM | POA: Diagnosis not present

## 2019-10-11 DIAGNOSIS — E119 Type 2 diabetes mellitus without complications: Secondary | ICD-10-CM | POA: Diagnosis not present

## 2019-10-18 DIAGNOSIS — R5383 Other fatigue: Secondary | ICD-10-CM | POA: Diagnosis not present

## 2019-10-18 DIAGNOSIS — Z0000 Encounter for general adult medical examination without abnormal findings: Secondary | ICD-10-CM | POA: Diagnosis not present

## 2019-10-18 DIAGNOSIS — E119 Type 2 diabetes mellitus without complications: Secondary | ICD-10-CM | POA: Diagnosis not present

## 2019-10-18 DIAGNOSIS — M791 Myalgia, unspecified site: Secondary | ICD-10-CM | POA: Diagnosis not present

## 2019-10-18 DIAGNOSIS — Z125 Encounter for screening for malignant neoplasm of prostate: Secondary | ICD-10-CM | POA: Diagnosis not present

## 2020-01-13 DIAGNOSIS — K2100 Gastro-esophageal reflux disease with esophagitis, without bleeding: Secondary | ICD-10-CM | POA: Diagnosis not present

## 2020-01-13 DIAGNOSIS — E119 Type 2 diabetes mellitus without complications: Secondary | ICD-10-CM | POA: Diagnosis not present

## 2020-04-12 DIAGNOSIS — E119 Type 2 diabetes mellitus without complications: Secondary | ICD-10-CM | POA: Diagnosis not present

## 2020-04-12 DIAGNOSIS — Z125 Encounter for screening for malignant neoplasm of prostate: Secondary | ICD-10-CM | POA: Diagnosis not present

## 2020-04-19 DIAGNOSIS — E119 Type 2 diabetes mellitus without complications: Secondary | ICD-10-CM | POA: Diagnosis not present

## 2020-04-19 DIAGNOSIS — K719 Toxic liver disease, unspecified: Secondary | ICD-10-CM | POA: Diagnosis not present

## 2020-04-19 DIAGNOSIS — Z0000 Encounter for general adult medical examination without abnormal findings: Secondary | ICD-10-CM | POA: Diagnosis not present

## 2021-01-29 ENCOUNTER — Other Ambulatory Visit: Payer: Self-pay

## 2021-01-29 ENCOUNTER — Other Ambulatory Visit: Payer: Self-pay | Admitting: Internal Medicine

## 2021-01-29 ENCOUNTER — Ambulatory Visit
Admission: RE | Admit: 2021-01-29 | Discharge: 2021-01-29 | Disposition: A | Discharge status: Home / Self Care | Payer: Medicare HMO | Source: Ambulatory Visit | Attending: Internal Medicine | Admitting: Internal Medicine

## 2021-01-29 DIAGNOSIS — S22000A Wedge compression fracture of unspecified thoracic vertebra, initial encounter for closed fracture: Secondary | ICD-10-CM

## 2021-02-28 ENCOUNTER — Encounter (HOSPITAL_COMMUNITY): Payer: Self-pay | Admitting: Radiology

## 2022-02-08 IMAGING — MR MR THORACIC SPINE W/O CM
6 series · 35 of 48 positions shown · non-contrast
Comparison: None.
COMPARISON: None.

Addendum:
CLINICAL DATA: Fall 3 days ago

EXAM:
MRI THORACIC SPINE WITHOUT CONTRAST
TECHNIQUE: Multiplanar, multisequence MR imaging of the thoracic spine was
performed. No intravenous contrast was administered.

[Series 18: T1 · sagittal · 6.0mm · 1.88mm/px · 3 of 9 slices shown (1 of 2)]
[im 1/9]
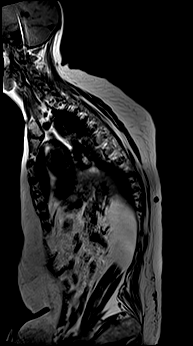
[im 5/9]
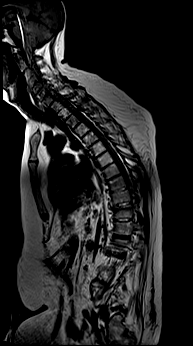
[im 9/9]
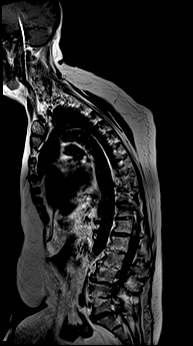

[Series 19: T2 · sagittal · 3.0mm · 1.33mm/px · 7 of 21 slices shown (1 of 2)]
[im 1/21]
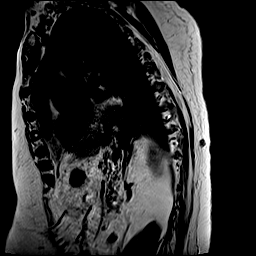
[im 4/21]
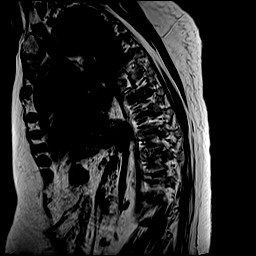
[im 7/21]
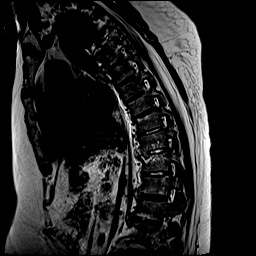
[im 11/21]
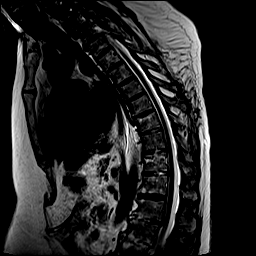
[im 14/21]
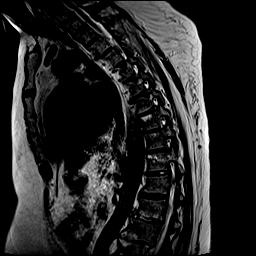
[im 17/21]
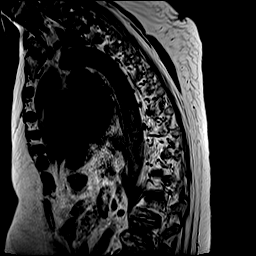
[im 21/21]
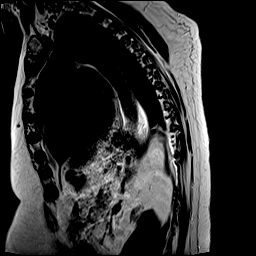

[Series 20: T1 · sagittal · 3.0mm · 1.33mm/px · 7 of 21 slices shown (2 of 2)]
[im 1/21]
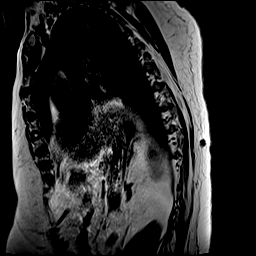
[im 4/21]
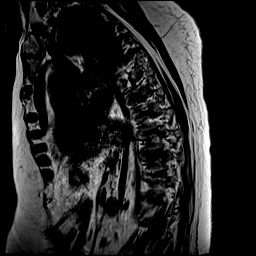
[im 7/21]
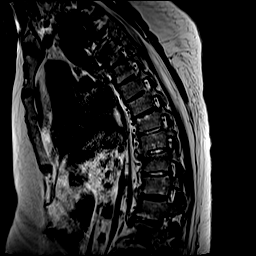
[im 11/21]
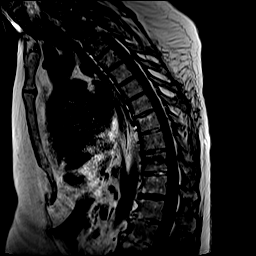
[im 14/21]
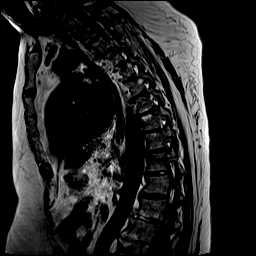
[im 17/21]
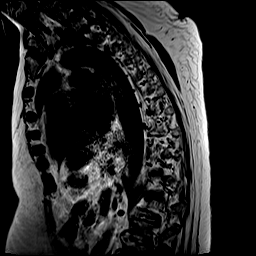
[im 21/21]
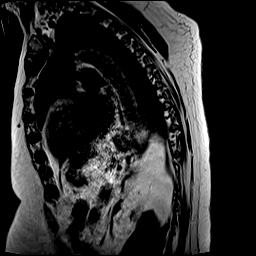

[Series 21: STIR · sagittal · 3.0mm · 0.66mm/px · 7 of 21 slices shown]
[im 1/21]
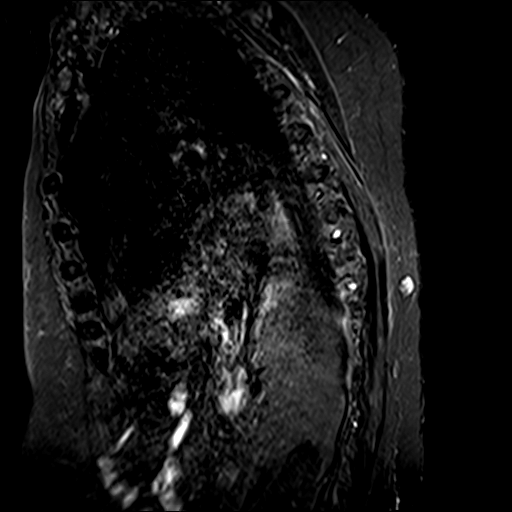
[im 4/21]
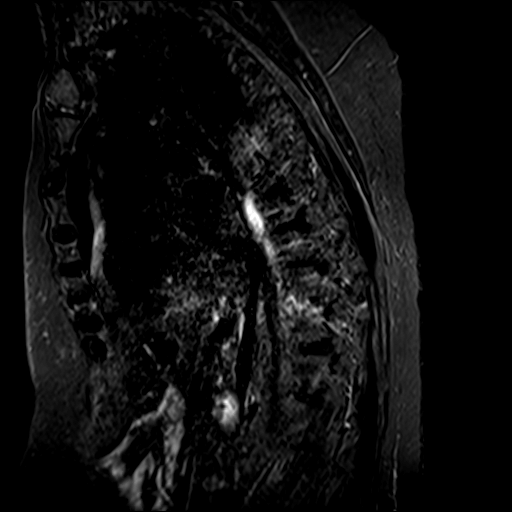
[im 7/21]
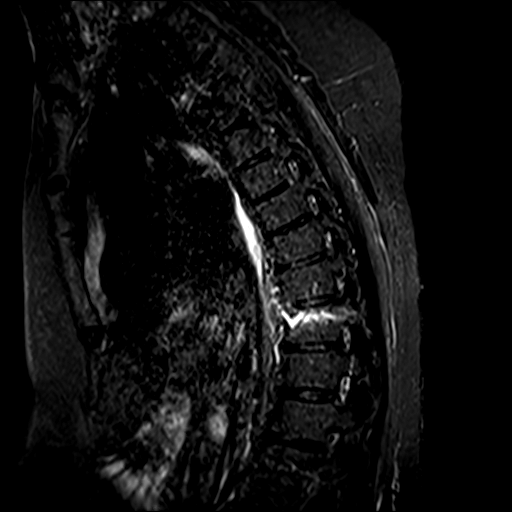
[im 11/21]
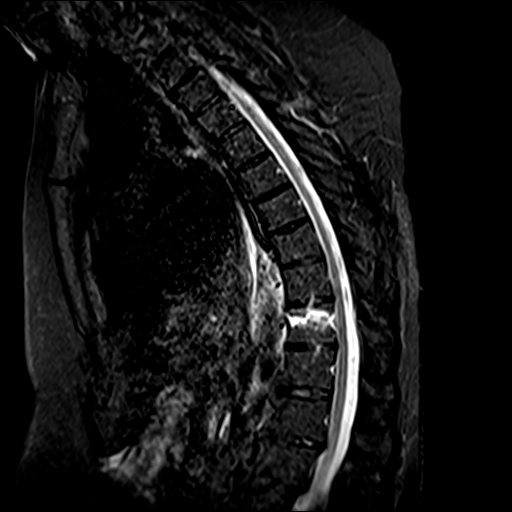
[im 14/21]
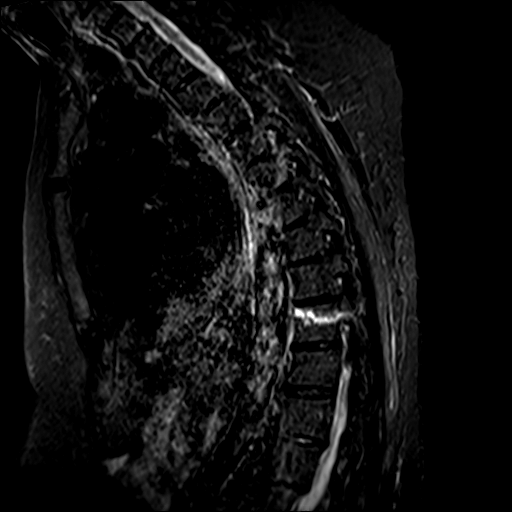
[im 17/21]
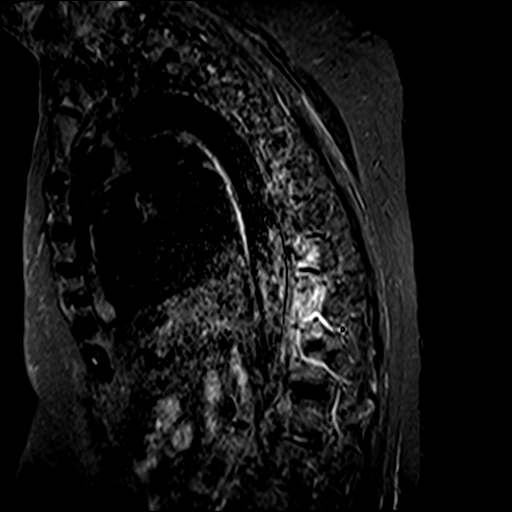
[im 21/21]
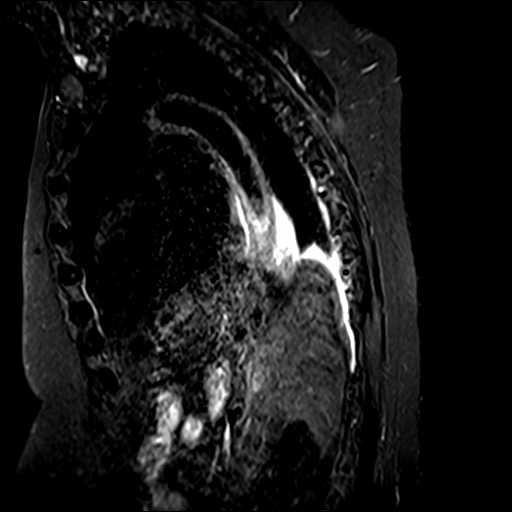

[Series 22: T2 · axial · 4.0mm · 0.59mm/px · z∈[-290,-84]mm · 9 of 39 slices shown (2 of 2)]
[im 1/39]
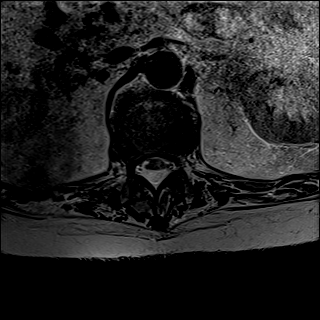
[im 7/39]
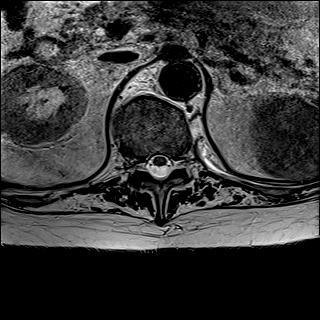
[im 11/39]
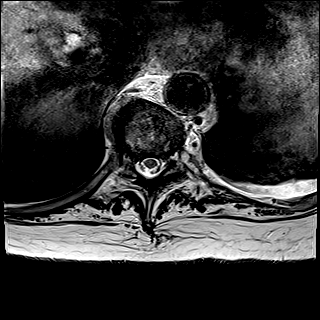
[im 18/39]
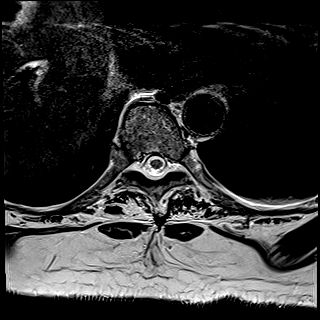
[im 21/39]
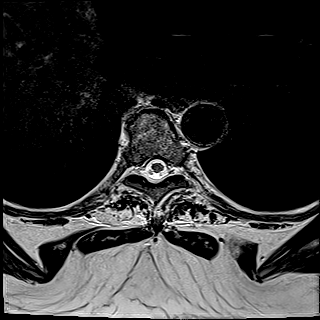
[im 28/39]
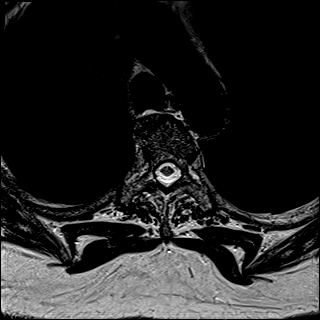
[im 32/39]
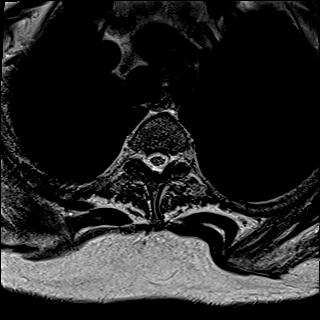
[im 35/39]
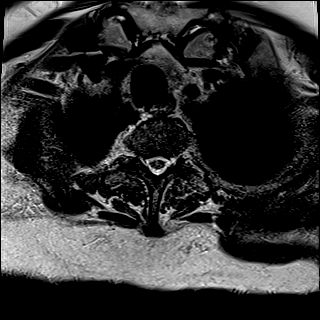
[im 39/39]
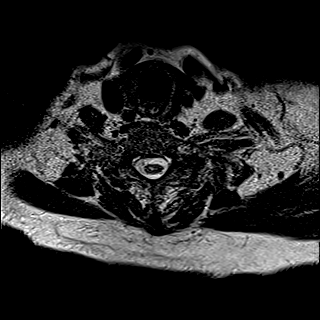

[Series 23: GRE · axial · 4.0mm · 0.37mm/px · z∈[-290,-250]mm · 2 of 39 slices shown]
[im 1/39]
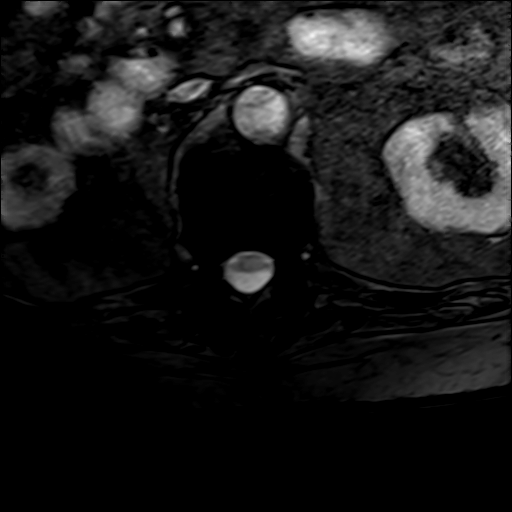
[im 7/39]
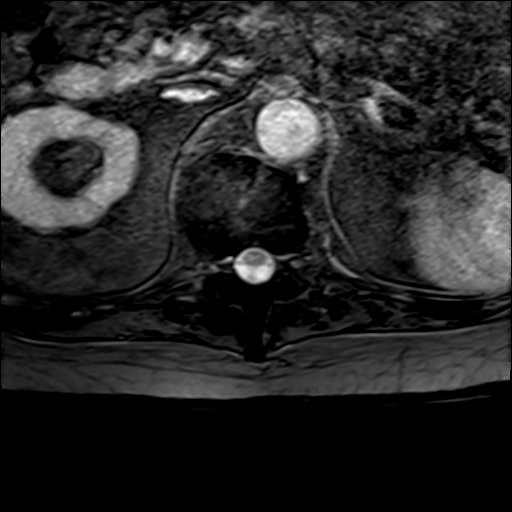

[35 of 48 positions shown; findings below may reference images not displayed]

FINDINGS: Alignment:  Normal alignment.  Exaggerated thoracic kyphosis.

Vertebrae: There is an acute/early subacute fracture of T11
involving the anterior and posterior walls. Fracture line likely
extends into the pedicles. There is minimal height loss. No
retropulsion.

Cord:  Normal signal and morphology.

Paraspinal and other soft tissues: Negative.

Disc levels:

There is diffuse mild degenerative disc disease without focal
herniation, spinal canal stenosis or neural impingement.
IMPRESSION: 1. Acute/early subacute T11 fracture involving the anterior and
posterior walls of the vertebral body with suspected extension into
the pedicles, which would indicate trans osseous tension band
disruption, an unstable injury. CT of the thoracic spine is
recommended for better osseous characterization.
2. Diffuse mild degenerative disc disease without spinal canal
stenosis or neural impingement.

ADDENDUM:
Critical Value/emergent results were called by telephone at the time
of interpretation on 01/29/2021 at [DATE] to Dr. Hankyu, who
verbally acknowledged these results.

*** End of Addendum ***
FINDINGS: Alignment:  Normal alignment.  Exaggerated thoracic kyphosis.

Vertebrae: There is an acute/early subacute fracture of T11
involving the anterior and posterior walls. Fracture line likely
extends into the pedicles. There is minimal height loss. No
retropulsion.

Cord:  Normal signal and morphology.

Paraspinal and other soft tissues: Negative.

Disc levels:

There is diffuse mild degenerative disc disease without focal
herniation, spinal canal stenosis or neural impingement.
IMPRESSION: 1. Acute/early subacute T11 fracture involving the anterior and
posterior walls of the vertebral body with suspected extension into
the pedicles, which would indicate trans osseous tension band
disruption, an unstable injury. CT of the thoracic spine is
recommended for better osseous characterization.
2. Diffuse mild degenerative disc disease without spinal canal
stenosis or neural impingement.
